# Patient Record
Sex: Male | Born: 1950 | Race: White | Hispanic: No | Marital: Single | Smoking: Never smoker
Health system: Southern US, Community
[De-identification: ages and names within clinical notes are randomized; demographics above are authoritative.]

## PROBLEM LIST (undated history)

## (undated) DIAGNOSIS — K5730 Diverticulosis of large intestine without perforation or abscess without bleeding: Secondary | ICD-10-CM

## (undated) DIAGNOSIS — E785 Hyperlipidemia, unspecified: Secondary | ICD-10-CM

## (undated) HISTORY — DX: Hyperlipidemia, unspecified: E78.5

## (undated) HISTORY — DX: Diverticulosis of large intestine without perforation or abscess without bleeding: K57.30

---

## 2001-12-19 ENCOUNTER — Encounter: Admission: RE | Admit: 2001-12-19 | Discharge: 2002-03-19 | Payer: Self-pay | Admitting: Family Medicine

## 2002-04-17 ENCOUNTER — Encounter: Admission: RE | Admit: 2002-04-17 | Discharge: 2002-07-16 | Payer: Self-pay | Admitting: Family Medicine

## 2005-12-08 ENCOUNTER — Ambulatory Visit: Payer: Self-pay | Admitting: Family Medicine

## 2006-01-20 ENCOUNTER — Ambulatory Visit: Payer: Self-pay | Admitting: Family Medicine

## 2006-01-20 LAB — CONVERTED CEMR LAB
BUN: 17 mg/dL (ref 6–23)
CO2: 26 meq/L (ref 19–32)
Creatinine, Ser: 0.8 mg/dL (ref 0.4–1.5)
Glucose, Bld: 209 mg/dL — ABNORMAL HIGH (ref 70–99)
Microalb Creat Ratio: 8.5 mg/g (ref 0.0–30.0)
Microalb, Ur: 1.6 mg/dL (ref 0.0–1.9)
Potassium: 4 meq/L (ref 3.5–5.1)

## 2006-03-13 ENCOUNTER — Ambulatory Visit: Payer: Self-pay | Admitting: Family Medicine

## 2006-03-13 LAB — CONVERTED CEMR LAB
BUN: 11 mg/dL (ref 6–23)
CO2: 26 meq/L (ref 19–32)
Calcium: 8.8 mg/dL (ref 8.4–10.5)
GFR calc non Af Amer: 125 mL/min
Glomerular Filtration Rate, Af Am: 152 mL/min/{1.73_m2}

## 2006-04-30 DIAGNOSIS — K5730 Diverticulosis of large intestine without perforation or abscess without bleeding: Secondary | ICD-10-CM | POA: Insufficient documentation

## 2006-04-30 DIAGNOSIS — E119 Type 2 diabetes mellitus without complications: Secondary | ICD-10-CM

## 2006-04-30 DIAGNOSIS — E785 Hyperlipidemia, unspecified: Secondary | ICD-10-CM

## 2006-12-14 ENCOUNTER — Encounter (INDEPENDENT_AMBULATORY_CARE_PROVIDER_SITE_OTHER): Payer: Self-pay | Admitting: Family Medicine

## 2007-03-29 ENCOUNTER — Ambulatory Visit: Payer: Self-pay | Admitting: Family Medicine

## 2007-03-29 ENCOUNTER — Encounter (INDEPENDENT_AMBULATORY_CARE_PROVIDER_SITE_OTHER): Payer: Self-pay | Admitting: Family Medicine

## 2007-03-29 DIAGNOSIS — R079 Chest pain, unspecified: Secondary | ICD-10-CM

## 2007-03-30 ENCOUNTER — Encounter (INDEPENDENT_AMBULATORY_CARE_PROVIDER_SITE_OTHER): Payer: Self-pay | Admitting: *Deleted

## 2007-03-30 ENCOUNTER — Telehealth (INDEPENDENT_AMBULATORY_CARE_PROVIDER_SITE_OTHER): Payer: Self-pay | Admitting: *Deleted

## 2007-03-30 LAB — CONVERTED CEMR LAB
ALT: 59 units/L — ABNORMAL HIGH (ref 0–53)
AST: 37 units/L (ref 0–37)
Direct LDL: 81.8 mg/dL
HDL: 28.7 mg/dL — ABNORMAL LOW (ref >39.0)
Triglycerides: 534 mg/dL (ref 0–149)

## 2007-04-06 ENCOUNTER — Ambulatory Visit: Payer: Self-pay | Admitting: Family Medicine

## 2007-04-12 LAB — CONVERTED CEMR LAB: Cholesterol: 111 mg/dL (ref 0–200)

## 2007-04-13 ENCOUNTER — Encounter (INDEPENDENT_AMBULATORY_CARE_PROVIDER_SITE_OTHER): Payer: Self-pay | Admitting: *Deleted

## 2007-04-16 ENCOUNTER — Ambulatory Visit: Payer: Self-pay | Admitting: Cardiology

## 2007-04-18 ENCOUNTER — Telehealth (INDEPENDENT_AMBULATORY_CARE_PROVIDER_SITE_OTHER): Payer: Self-pay | Admitting: *Deleted

## 2007-04-24 ENCOUNTER — Encounter (INDEPENDENT_AMBULATORY_CARE_PROVIDER_SITE_OTHER): Payer: Self-pay | Admitting: Family Medicine

## 2007-04-24 ENCOUNTER — Ambulatory Visit: Payer: Self-pay

## 2007-05-08 ENCOUNTER — Telehealth (INDEPENDENT_AMBULATORY_CARE_PROVIDER_SITE_OTHER): Payer: Self-pay | Admitting: Family Medicine

## 2007-05-16 ENCOUNTER — Ambulatory Visit: Payer: Self-pay | Admitting: Family Medicine

## 2007-05-17 ENCOUNTER — Telehealth (INDEPENDENT_AMBULATORY_CARE_PROVIDER_SITE_OTHER): Payer: Self-pay | Admitting: *Deleted

## 2007-05-17 LAB — CONVERTED CEMR LAB
BUN: 11 mg/dL (ref 6–23)
CO2: 29 meq/L (ref 19–32)
Calcium: 9.4 mg/dL (ref 8.4–10.5)
Chloride: 104 meq/L (ref 96–112)
Creatinine, Ser: 0.6 mg/dL (ref 0.4–1.5)

## 2007-06-01 ENCOUNTER — Ambulatory Visit: Payer: Self-pay | Admitting: Family Medicine

## 2007-06-01 ENCOUNTER — Telehealth (INDEPENDENT_AMBULATORY_CARE_PROVIDER_SITE_OTHER): Payer: Self-pay | Admitting: *Deleted

## 2007-06-01 LAB — CONVERTED CEMR LAB
Bilirubin Urine: NEGATIVE
Urobilinogen, UA: 0.2

## 2007-06-02 ENCOUNTER — Encounter: Payer: Self-pay | Admitting: Family Medicine

## 2007-06-02 LAB — CONVERTED CEMR LAB
AST: 35 units/L (ref 0–37)
Albumin: 5 g/dL (ref 3.5–5.2)
Alkaline Phosphatase: 45 units/L (ref 39–117)
CO2: 24 meq/L (ref 19–32)
Glucose, Bld: 145 mg/dL — ABNORMAL HIGH (ref 70–99)
Potassium: 4.1 meq/L (ref 3.5–5.3)
Sodium: 141 meq/L (ref 135–145)
Total Bilirubin: 1 mg/dL (ref 0.3–1.2)

## 2007-06-04 ENCOUNTER — Encounter (INDEPENDENT_AMBULATORY_CARE_PROVIDER_SITE_OTHER): Payer: Self-pay | Admitting: *Deleted

## 2007-06-18 ENCOUNTER — Ambulatory Visit: Payer: Self-pay | Admitting: Family Medicine

## 2007-06-19 ENCOUNTER — Encounter (INDEPENDENT_AMBULATORY_CARE_PROVIDER_SITE_OTHER): Payer: Self-pay | Admitting: *Deleted

## 2007-06-25 ENCOUNTER — Encounter (INDEPENDENT_AMBULATORY_CARE_PROVIDER_SITE_OTHER): Payer: Self-pay | Admitting: *Deleted

## 2007-06-26 ENCOUNTER — Telehealth (INDEPENDENT_AMBULATORY_CARE_PROVIDER_SITE_OTHER): Payer: Self-pay | Admitting: *Deleted

## 2007-09-17 ENCOUNTER — Ambulatory Visit: Payer: Self-pay | Admitting: Family Medicine

## 2007-09-26 ENCOUNTER — Telehealth (INDEPENDENT_AMBULATORY_CARE_PROVIDER_SITE_OTHER): Payer: Self-pay | Admitting: *Deleted

## 2007-09-27 ENCOUNTER — Encounter (INDEPENDENT_AMBULATORY_CARE_PROVIDER_SITE_OTHER): Payer: Self-pay | Admitting: *Deleted

## 2007-09-27 LAB — CONVERTED CEMR LAB
Albumin: 4.3 g/dL (ref 3.5–5.2)
Alkaline Phosphatase: 40 units/L (ref 39–117)
Bilirubin, Direct: 0.2 mg/dL (ref 0.0–0.3)
Calcium: 9.3 mg/dL (ref 8.4–10.5)
Direct LDL: 34.5 mg/dL
GFR calc Af Amer: 151 mL/min
GFR calc non Af Amer: 124 mL/min
Glucose, Bld: 130 mg/dL — ABNORMAL HIGH (ref 70–99)
HDL: 24.9 mg/dL — ABNORMAL LOW (ref >39.0)
Potassium: 4.3 meq/L (ref 3.5–5.1)
Sodium: 143 meq/L (ref 135–145)
Total Protein: 6.8 g/dL (ref 6.0–8.3)
Triglycerides: 201 mg/dL (ref 0–149)
VLDL: 40 mg/dL (ref 0–40)

## 2007-12-26 ENCOUNTER — Ambulatory Visit: Payer: Self-pay | Admitting: Family Medicine

## 2008-01-01 LAB — CONVERTED CEMR LAB
AST: 34 units/L (ref 0–37)
Albumin: 4.2 g/dL (ref 3.5–5.2)
Alkaline Phosphatase: 31 units/L — ABNORMAL LOW (ref 39–117)
BUN: 17 mg/dL (ref 6–23)
Chloride: 104 meq/L (ref 96–112)
Cholesterol: 91 mg/dL (ref 0–200)
Creatinine, Ser: 0.7 mg/dL (ref 0.4–1.5)
Glucose, Bld: 161 mg/dL — ABNORMAL HIGH (ref 70–99)
Hgb A1c MFr Bld: 6.8 % — ABNORMAL HIGH (ref 4.6–6.0)
Potassium: 4 meq/L (ref 3.5–5.1)
Total Protein: 6.8 g/dL (ref 6.0–8.3)
Triglycerides: 181 mg/dL — ABNORMAL HIGH (ref 0–149)
VLDL: 36 mg/dL (ref 0–40)

## 2008-01-02 ENCOUNTER — Encounter (INDEPENDENT_AMBULATORY_CARE_PROVIDER_SITE_OTHER): Payer: Self-pay | Admitting: *Deleted

## 2008-01-07 ENCOUNTER — Telehealth (INDEPENDENT_AMBULATORY_CARE_PROVIDER_SITE_OTHER): Payer: Self-pay | Admitting: *Deleted

## 2008-03-13 ENCOUNTER — Telehealth (INDEPENDENT_AMBULATORY_CARE_PROVIDER_SITE_OTHER): Payer: Self-pay | Admitting: *Deleted

## 2008-04-14 ENCOUNTER — Telehealth: Payer: Self-pay | Admitting: Family Medicine

## 2008-04-21 ENCOUNTER — Encounter: Payer: Self-pay | Admitting: Family Medicine

## 2008-04-28 ENCOUNTER — Telehealth (INDEPENDENT_AMBULATORY_CARE_PROVIDER_SITE_OTHER): Payer: Self-pay | Admitting: *Deleted

## 2008-04-30 ENCOUNTER — Encounter (INDEPENDENT_AMBULATORY_CARE_PROVIDER_SITE_OTHER): Payer: Self-pay | Admitting: *Deleted

## 2008-05-21 ENCOUNTER — Telehealth (INDEPENDENT_AMBULATORY_CARE_PROVIDER_SITE_OTHER): Payer: Self-pay | Admitting: *Deleted

## 2008-05-26 ENCOUNTER — Telehealth (INDEPENDENT_AMBULATORY_CARE_PROVIDER_SITE_OTHER): Payer: Self-pay | Admitting: *Deleted

## 2008-05-26 ENCOUNTER — Ambulatory Visit: Payer: Self-pay | Admitting: Family Medicine

## 2008-05-26 DIAGNOSIS — L570 Actinic keratosis: Secondary | ICD-10-CM

## 2008-07-09 ENCOUNTER — Encounter: Payer: Self-pay | Admitting: Family Medicine

## 2008-10-28 ENCOUNTER — Telehealth (INDEPENDENT_AMBULATORY_CARE_PROVIDER_SITE_OTHER): Payer: Self-pay | Admitting: *Deleted

## 2008-10-31 ENCOUNTER — Ambulatory Visit: Payer: Self-pay | Admitting: Family Medicine

## 2008-11-04 ENCOUNTER — Telehealth (INDEPENDENT_AMBULATORY_CARE_PROVIDER_SITE_OTHER): Payer: Self-pay | Admitting: *Deleted

## 2008-11-04 DIAGNOSIS — Z8639 Personal history of other endocrine, nutritional and metabolic disease: Secondary | ICD-10-CM | POA: Insufficient documentation

## 2008-11-04 DIAGNOSIS — Z862 Personal history of diseases of the blood and blood-forming organs and certain disorders involving the immune mechanism: Secondary | ICD-10-CM

## 2008-11-04 LAB — CONVERTED CEMR LAB
ALT: 67 units/L — ABNORMAL HIGH (ref 0–53)
AST: 54 units/L — ABNORMAL HIGH (ref 0–37)
Alkaline Phosphatase: 47 units/L (ref 39–117)
BUN: 15 mg/dL (ref 6–23)
Bilirubin, Direct: 0.2 mg/dL (ref 0.0–0.3)
Calcium: 9.1 mg/dL (ref 8.4–10.5)
Creatinine, Ser: 0.7 mg/dL (ref 0.4–1.5)
GFR calc non Af Amer: 123.88 mL/min (ref >60)
Glucose, Bld: 216 mg/dL — ABNORMAL HIGH (ref 70–99)
Total Bilirubin: 1.9 mg/dL — ABNORMAL HIGH (ref 0.3–1.2)

## 2008-11-06 ENCOUNTER — Ambulatory Visit: Payer: Self-pay | Admitting: Family Medicine

## 2008-11-07 ENCOUNTER — Encounter: Admission: RE | Admit: 2008-11-07 | Discharge: 2008-11-07 | Payer: Self-pay | Admitting: Family Medicine

## 2008-11-20 ENCOUNTER — Ambulatory Visit: Payer: Self-pay | Admitting: Family Medicine

## 2008-11-27 ENCOUNTER — Encounter: Payer: Self-pay | Admitting: Family Medicine

## 2008-11-27 ENCOUNTER — Telehealth: Payer: Self-pay | Admitting: Family Medicine

## 2008-12-02 LAB — CONVERTED CEMR LAB
AST: 42 units/L — ABNORMAL HIGH (ref 0–37)
Albumin: 4.3 g/dL (ref 3.5–5.2)
BUN: 15 mg/dL (ref 6–23)
Cholesterol: 86 mg/dL (ref 0–200)
Creatinine, Ser: 0.6 mg/dL (ref 0.4–1.5)
Creatinine,U: 149.1 mg/dL
GFR calc non Af Amer: 147.97 mL/min (ref >60)
GGT: 62 units/L — ABNORMAL HIGH (ref 7–51)
Glucose, Bld: 150 mg/dL — ABNORMAL HIGH (ref 70–99)
Microalb, Ur: 1.2 mg/dL (ref 0.0–1.9)
Potassium: 4.2 meq/L (ref 3.5–5.1)
VLDL: 21.8 mg/dL (ref 0.0–40.0)

## 2008-12-03 ENCOUNTER — Ambulatory Visit: Payer: Self-pay | Admitting: Family Medicine

## 2008-12-03 DIAGNOSIS — J069 Acute upper respiratory infection, unspecified: Secondary | ICD-10-CM | POA: Insufficient documentation

## 2009-10-20 ENCOUNTER — Encounter (INDEPENDENT_AMBULATORY_CARE_PROVIDER_SITE_OTHER): Payer: Self-pay | Admitting: *Deleted

## 2009-10-20 ENCOUNTER — Telehealth (INDEPENDENT_AMBULATORY_CARE_PROVIDER_SITE_OTHER): Payer: Self-pay | Admitting: *Deleted

## 2009-11-27 ENCOUNTER — Encounter: Payer: Self-pay | Admitting: Family Medicine

## 2010-04-04 LAB — CONVERTED CEMR LAB
ALT: 33 units/L (ref 0–53)
Albumin: 4.2 g/dL (ref 3.5–5.2)
BUN: 8 mg/dL (ref 6–23)
CO2: 28 meq/L (ref 19–32)
Calcium: 8.9 mg/dL (ref 8.4–10.5)
Creatinine, Ser: 0.6 mg/dL (ref 0.4–1.5)
Creatinine,U: 119.2 mg/dL
Glucose, Bld: 159 mg/dL — ABNORMAL HIGH (ref 70–99)
Microalb Creat Ratio: 15.9 mg/g (ref 0.0–30.0)
Total Bilirubin: 1.4 mg/dL — ABNORMAL HIGH (ref 0.3–1.2)
Total Protein: 6.7 g/dL (ref 6.0–8.3)

## 2010-04-06 NOTE — Letter (Signed)
Summary: Care Considerations Regarding A1C Monitoring & Eye Exam/Active H  Care Considerations Regarding A1C Monitoring & Eye Exam/Active Health Mgmt   Imported By: Lanelle Bal 12/16/2009 13:42:08  _____________________________________________________________________  External Attachment:    Type:   Image     Comment:   External Document

## 2010-04-06 NOTE — Letter (Signed)
Summary: Primary Care Appointment Letter  [facility] [place]  [address] [place], [place] [address]   Phone: [phone]  Fax: [phone]    10/20/2009 MRN: [id number]  [facility] [address] [place], [place]  [address]  Dear Mr. Nutting,   Your Primary Care Physician Loreen Freud DO has indicated that:    ___X____it is time to schedule an appointment.    _______you missed your appointment on______ and need to call and          reschedule.    ___X____you need to have lab work done.    _______you need to schedule an appointment discuss lab or test results.    _______you need to call to reschedule your appointment that is                       scheduled on _________.     Please call our office as soon as possible. Our phone number is [phone]. Please press option 1. Our office is open 8a-5p, Monday through Friday.     Thank you,    [facility]

## 2010-04-06 NOTE — Progress Notes (Signed)
Summary: refill  Phone Note Refill Request Message from:  Fax from Pharmacy on October 20, 2009 8:56 AM  Refills Requested: Medication #1:  JANUVIA 100 MG  TABS Take one tablet daily  Medication #2:  GLUCOPHAGE 1000 MG  TABS Take one tablet twice daily  Medication #3:  ZOCOR 20 MG  TABS 1 by mouth at bedtime [facility] fax [phone]  Initial call taken by: Okey Regal [place],  October 20, 2009 8:57 AM    New/Updated Medications: GLUCOPHAGE 1000 MG  TABS (METFORMIN HCL) Take one tablet twice daily**labs due** ZOCOR 20 MG  TABS (SIMVASTATIN) 1 by mouth at bedtime**labs due** JANUVIA 100 MG  TABS (SITAGLIPTIN PHOSPHATE) Take one tablet daily*OFFICE VISIT DUE NOW** Prescriptions: GLUCOPHAGE 1000 MG  TABS (METFORMIN HCL) Take one tablet twice daily**labs due**  #180 x 0   Entered by:   Jeremy Johann CMA   Authorized by:   Loreen Freud DO   Signed by:   Jeremy Johann CMA on 10/20/2009   Method used:   Faxed to ...       [facility] [address] Environmental education officer)       [facility]       [address]       [place], [place]  [address]       Ph: [phone]       Fax: [phone]   RxID:   [id number] ZOCOR 20 MG  TABS (SIMVASTATIN) 1 by mouth at bedtime**labs due**  #90 x 0   Entered by:   Jeremy Johann CMA   Authorized by:   Loreen Freud DO   Signed by:   Jeremy Johann CMA on 10/20/2009   Method used:   Faxed to ...       [facility] [address] Environmental education officer)       [facility]       [address]       [place], [place]  [address]       Ph: [phone]       Fax: [phone]   RxID:   [id number] JANUVIA 100 MG  TABS (SITAGLIPTIN PHOSPHATE) Take one tablet daily*OFFICE VISIT DUE NOW**  #90 x 0   Entered by:   Jeremy Johann CMA   Authorized by:   Loreen Freud DO   Signed by:   Jeremy Johann CMA on 10/20/2009   Method used:   Faxed to ...       [facility] [address] Environmental education officer)       [facility]       [address]       [place], [place]  [address]       Ph: [phone]       Fax: [phone]   RxID:   [id number]

## 2010-04-13 DIAGNOSIS — E119 Type 2 diabetes mellitus without complications: Secondary | ICD-10-CM

## 2010-04-13 DIAGNOSIS — Z0000 Encounter for general adult medical examination without abnormal findings: Secondary | ICD-10-CM

## 2010-04-13 DIAGNOSIS — E785 Hyperlipidemia, unspecified: Secondary | ICD-10-CM

## 2010-04-14 ENCOUNTER — Other Ambulatory Visit: Payer: Self-pay | Admitting: Family Medicine

## 2010-04-14 ENCOUNTER — Encounter: Payer: Self-pay | Admitting: Family Medicine

## 2010-04-14 ENCOUNTER — Encounter (INDEPENDENT_AMBULATORY_CARE_PROVIDER_SITE_OTHER): Payer: Self-pay | Admitting: Family Medicine

## 2010-04-14 DIAGNOSIS — E119 Type 2 diabetes mellitus without complications: Secondary | ICD-10-CM

## 2010-04-14 DIAGNOSIS — R1319 Other dysphagia: Secondary | ICD-10-CM | POA: Insufficient documentation

## 2010-04-14 DIAGNOSIS — E785 Hyperlipidemia, unspecified: Secondary | ICD-10-CM

## 2010-04-14 DIAGNOSIS — Z0000 Encounter for general adult medical examination without abnormal findings: Secondary | ICD-10-CM

## 2010-04-14 LAB — BASIC METABOLIC PANEL
GFR: 131.9 mL/min (ref >60.00)
Potassium: 4.1 mEq/L (ref 3.5–5.1)
Sodium: 140 mEq/L (ref 135–145)

## 2010-04-14 LAB — CONVERTED CEMR LAB
Blood in Urine, dipstick: NEGATIVE
Glucose, Urine, Semiquant: NEGATIVE
Specific Gravity, Urine: 1.02
pH: 6

## 2010-04-14 LAB — LIPID PANEL
HDL: 29.2 mg/dL — ABNORMAL LOW (ref >39.00)
Total CHOL/HDL Ratio: 3
VLDL: 50.4 mg/dL — ABNORMAL HIGH (ref 0.0–40.0)

## 2010-04-14 LAB — HEPATIC FUNCTION PANEL
ALT: 39 U/L (ref 0–53)
AST: 32 U/L (ref 0–37)
Albumin: 4.6 g/dL (ref 3.5–5.2)
Alkaline Phosphatase: 43 U/L (ref 39–117)

## 2010-04-14 LAB — CBC WITH DIFFERENTIAL/PLATELET
Eosinophils Relative: 2.3 % (ref 0.0–5.0)
HCT: 44.3 % (ref 39.0–52.0)
Hemoglobin: 15.5 g/dL (ref 13.0–17.0)
Lymphs Abs: 2.4 10*3/uL (ref 0.7–4.0)
Monocytes Relative: 6.7 % (ref 3.0–12.0)
Neutro Abs: 5.6 10*3/uL (ref 1.4–7.7)
RBC: 4.95 Mil/uL (ref 4.22–5.81)
WBC: 8.8 10*3/uL (ref 4.5–10.5)

## 2010-04-14 LAB — LDL CHOLESTEROL, DIRECT: Direct LDL: 34.1 mg/dL

## 2010-04-14 LAB — MICROALBUMIN / CREATININE URINE RATIO: Microalb Creat Ratio: 2.8 mg/g (ref 0.0–30.0)

## 2010-04-14 LAB — TSH: TSH: 1.11 u[IU]/mL (ref 0.35–5.50)

## 2010-04-15 ENCOUNTER — Telehealth (INDEPENDENT_AMBULATORY_CARE_PROVIDER_SITE_OTHER): Payer: Self-pay | Admitting: *Deleted

## 2010-04-16 ENCOUNTER — Encounter: Payer: Self-pay | Admitting: Family Medicine

## 2010-04-16 ENCOUNTER — Telehealth (INDEPENDENT_AMBULATORY_CARE_PROVIDER_SITE_OTHER): Payer: Self-pay | Admitting: *Deleted

## 2010-04-16 ENCOUNTER — Other Ambulatory Visit: Payer: Self-pay | Admitting: Gastroenterology

## 2010-04-16 DIAGNOSIS — R1906 Epigastric swelling, mass or lump: Secondary | ICD-10-CM

## 2010-04-19 ENCOUNTER — Encounter: Payer: Self-pay | Admitting: Family Medicine

## 2010-04-20 ENCOUNTER — Encounter: Payer: Self-pay | Admitting: Family Medicine

## 2010-04-20 ENCOUNTER — Telehealth (INDEPENDENT_AMBULATORY_CARE_PROVIDER_SITE_OTHER): Payer: Self-pay | Admitting: *Deleted

## 2010-04-22 ENCOUNTER — Other Ambulatory Visit: Payer: BC Managed Care – PPO

## 2010-04-22 NOTE — Assessment & Plan Note (Signed)
Summary: yearly//fd   Vital Signs:  Patient profile:   60 year old male Height:      67 inches Weight:      173.0 pounds BMI:     27.19 Pulse rate:   74 / minute Pulse rhythm:   regular BP sitting:   110 / 72  (right arm) Cuff size:   large  Vitals Entered By: Almeta Monas CMA Duncan Dull) (April 14, 2010 8:54 AM) CC: CPX/Fasting----no complaints   History of Present Illness: Pt here for cpe and labs.   Type 1 diabetes mellitus follow-up      This is a 60 year old man who presents with Type 2 diabetes mellitus follow-up.  The patient denies polyuria, polydipsia, blurred vision, self managed hypoglycemia, hypoglycemia requiring help, weight loss, weight gain, and numbness of extremities.  The patient denies the following symptoms: neuropathic pain, chest pain, vomiting, orthostatic symptoms, poor wound healing, intermittent claudication, vision loss, and foot ulcer.  Since the last visit the patient reports good dietary compliance, compliance with medications, not exercising regularly, and monitoring blood glucose.  The patient has been measuring capillary blood glucose before breakfast and before dinner.  Since the last visit, the patient reports having had eye care by an ophthalmologist and no foot care.    Hyperlipidemia follow-up      The patient also presents for Hyperlipidemia follow-up.  The patient denies muscle aches, GI upset, abdominal pain, flushing, itching, constipation, diarrhea, and fatigue.  The patient denies the following symptoms: chest pain/pressure, exercise intolerance, dypsnea, palpitations, syncope, and pedal edema.  Compliance with medications (by patient report) has been near 100%.  Dietary compliance has been good.  The patient reports no exercise.  Adjunctive measures currently used by the patient include ASA and fish oil supplements.    Pt c/o loose stools most of the time---no abd cramping but he does have L upper chest pain with eating a big meal and than it  subsides gradually.   no otc trieds.   Preventive Screening-Counseling & Management  Alcohol-Tobacco     Alcohol drinks/day: <1     Alcohol type: spirits     Smoking Status: never  Caffeine-Diet-Exercise     Caffeine use/day: 0     Does Patient Exercise: no     Exercise Counseling: to improve exercise regimen  Hep-HIV-STD-Contraception     Dental Visit-last 6 months yes     Dental Care Counseling: not indicated; dental care within six months      Sexual History:  currently monogamous and live in girlfriend.    Problems Prior to Update: 1)  ? of Ibs  (ICD-564.1) 2)  Other Dysphagia  (ICD-787.29) 3)  Uri  (ICD-465.9) 4)  Liver Function Tests, Abnormal, Hx of  (ICD-V12.2) 5)  Actinic Keratosis  (ICD-702.0) 6)  Family History Diabetes 1st Degree Relative  (ICD-V18.0) 7)  Preventive Health Care  (ICD-V70.0) 8)  Chest Pain  (ICD-786.50) 9)  Hyperlipidemia  (ICD-272.4) 10)  Diverticulosis, Colon  (ICD-562.10) 11)  Diabetes Mellitus, Type II  (ICD-250.00)  Medications Prior to Update: 1)  Glucophage 1000 Mg  Tabs (Metformin Hcl) .... Take One Tablet Twice Daily**labs Due** 2)  Zocor 20 Mg  Tabs (Simvastatin) .Marland Kitchen.. 1 By Mouth At Bedtime**labs Due** 3)  Januvia 100 Mg  Tabs (Sitagliptin Phosphate) .... Take One Tablet Daily*office Visit Due Now** 4)  Lovaza 1 Gm Caps (Omega-3-Acid Ethyl Esters) .... Take 2 Tablets Daily Two Times A Day 5)  Actos 30 Mg Tabs (Pioglitazone  Hcl) .... 1 By Mouth Once Daly. 6)  Xyzal 5 Mg Tabs (Levocetirizine Dihydrochloride) .Marland Kitchen.. 1 By Mouth Once Daily  Current Medications (verified): 1)  Janumet 50-1000 Mg Tabs (Sitagliptin-Metformin Hcl) .Marland Kitchen.. 1 By Mouth Two Times A Day 2)  Zocor 20 Mg  Tabs (Simvastatin) .Marland Kitchen.. 1 By Mouth At Bedtime 3)  Fish Oil 1000 Mg Caps (Omega-3 Fatty Acids) .Marland Kitchen.. 1 By Mouth Two Times A Day 4)  Aspirin 81 Mg Tbec (Aspirin) .Marland Kitchen.. 1 By Mouth Once Daily 5)  Lisinopril 5 Mg Tabs (Lisinopril) .Marland Kitchen.. 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 CAD-- B bypass at 45 and M MI at 60yo Family History Diabetes 1st degree relative--- B and F  Family History Kidney disease Family History Liver disease  Social History: Reviewed history from 03/29/2007 and no changes required. 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 Eyes:  Denies blurring, discharge, double vision, eye irritation, eye pain, halos, itching, light sensitivity, red eye, vision loss-1 eye, and vision loss-both eyes; optho-- q1y--[facility]by. ENT:  Denies decreased hearing, difficulty swallowing, ear discharge, earache, hoarseness, nasal congestion, nosebleeds, postnasal drainage, ringing in ears, sinus pressure, and sore throat. CV:  Denies bluish discoloration of lips or nails, chest pain or discomfort, difficulty breathing at  night, difficulty breathing while lying down, fainting, fatigue, leg cramps with exertion, lightheadness, near fainting, palpitations, shortness of breath with exertion, swelling of feet, swelling of hands, and weight gain. Resp:  Denies chest discomfort, chest pain with inspiration, cough, coughing up blood, excessive snoring, hypersomnolence, morning headaches, pleuritic, shortness of breath, sputum productive, and wheezing. GI:  Denies abdominal pain, bloody stools, change in bowel habits, constipation, dark tarry stools, diarrhea, excessive appetite, gas, hemorrhoids, indigestion, loss of appetite, nausea, vomiting, vomiting blood, and yellowish skin color. GU:  Denies decreased libido, discharge, dysuria, erectile dysfunction, genital sores, hematuria, incontinence, nocturia, urinary frequency, and urinary hesitancy. MS:  Denies joint pain, joint redness, joint swelling, loss of strength, low back pain, mid back pain, muscle aches, muscle , cramps, muscle weakness, stiffness, and thoracic pain. Derm:  Denies changes in color of skin, changes in nail beds, dryness, excessive perspiration, flushing, hair loss, insect bite(s), itching, lesion(s), poor wound healing, and rash. Neuro:  Denies brief paralysis, difficulty with concentration, disturbances in coordination, falling down, headaches, inability to speak, memory loss, numbness, poor balance, seizures, sensation of room spinning, tingling, tremors, visual disturbances, and weakness. Psych:  Denies alternate hallucination ( auditory/visual), anxiety, depression, easily angered, easily tearful,  irritability, mental problems, panic attacks, sense of great danger, suicidal thoughts/plans, thoughts of violence, unusual visions or sounds, and thoughts /plans of harming others. Endo:  Denies cold intolerance, excessive hunger, excessive thirst, excessive urination, heat intolerance, polyuria, and weight change. Heme:  Denies abnormal bruising, bleeding,  enlarge lymph nodes, fevers, pallor, and skin discoloration. Allergy:  Denies hives or rash, itching eyes, persistent infections, seasonal allergies, and sneezing.  Physical Exam  General:  Well-developed,well-nourished,in no acute distress; alert,appropriate and cooperative throughout examination Head:  Normocephalic and atraumatic without obvious abnormalities. No apparent alopecia or balding. Eyes:  pupils equal, pupils round, pupils reactive to light, and no injection.   Ears:  External ear exam shows no significant lesions or deformities.  Otoscopic examination reveals clear canals, tympanic membranes are intact bilaterally without bulging, retraction, inflammation or discharge. Hearing is grossly normal bilaterally. Nose:  External nasal examination shows no deformity or inflammation. Nasal mucosa are pink and moist without lesions or exudates. Mouth:  Oral mucosa and oropharynx without lesions or exudates.  Teeth in good repair. Neck:  No deformities, masses, or tenderness noted. Chest Wall:  No deformities, masses, tenderness or gynecomastia noted. Breasts:  No masses or gynecomastia noted Lungs:  Normal respiratory effort, chest expands symmetrically. Lungs are clear to auscultation, no crackles or wheezes. Heart:  normal rate and no murmur.   Abdomen:  Bowel sounds positive,abdomen soft and non-tender without masses, organomegaly or hernias noted. Rectal:  No external abnormalities noted. Normal sphincter tone. No rectal masses or tenderness. Genitalia:  Testes bilaterally descended without nodularity, tenderness or masses. No scrotal masses or lesions. No penis lesions or urethral discharge. Prostate:  Prostate gland firm and smooth, no enlargement, nodularity, tenderness, mass, asymmetry or induration. Msk:  normal ROM, no joint tenderness, no joint swelling, no joint warmth, no redness over joints, no joint deformities, no joint instability, and no crepitation.   Pulses:  R  posterior tibial normal, R dorsalis pedis normal, R carotid normal, L posterior tibial normal, L dorsalis pedis normal, and L carotid normal.   Extremities:  No clubbing, cyanosis, edema, or deformity noted with normal full range of motion of all joints.   Neurologic:  No cranial nerve deficits noted. Station and gait are normal. Plantar reflexes are down-going bilaterally. DTRs are symmetrical throughout. Sensory, motor and coordinative functions appear intact. Skin:  Intact without suspicious lesions or rashes Cervical Nodes:  No lymphadenopathy noted Axillary Nodes:  No palpable lymphadenopathy Psych:  Cognition and judgment appear intact. Alert and cooperative with normal attention span and concentration. No apparent delusions, illusions, hallucinations  Diabetes Management Exam:    Foot Exam (with socks and/or shoes not present):       Sensory-Pinprick/Light touch:          Left medial foot (L-4): normal          Left dorsal foot (L-5): normal          Left lateral foot (S-1): normal          Right medial foot (L-4): normal          Right dorsal foot (L-5): normal          Right lateral foot (S-1): normal       Sensory-Monofilament:          Left foot: normal          Right foot: normal       Inspection:          Left foot: normal  Right foot: normal       Nails:          Left foot: normal          Right foot: normal    Eye Exam:       Eye Exam done elsewhere          Date: 04/22/2009          Results: normal          Done by: digby   Impression & Recommendations:  Problem # 1:  PREVENTIVE HEALTH CARE (ICD-V70.0)  Orders: Venipuncture (88416) TLB-Lipid Panel (80061-LIPID) TLB-BMP (Basic Metabolic Panel-BMET) (80048-METABOL) TLB-CBC Platelet - w/Differential (85025-CBCD) TLB-Hepatic/Liver Function Pnl (80076-HEPATIC) TLB-TSH (Thyroid Stimulating Hormone) (84443-TSH) TLB-A1C / Hgb A1C (Glycohemoglobin) (83036-A1C) TLB-Microalbumin/Creat Ratio, Urine  (82043-MALB) TLB-PSA (Prostate Specific Antigen) (84153-PSA) UA Dipstick W/ Micro (manual) (60630) EKG w/ Interpretation (93000)  Reviewed preventive care protocols, scheduled due services, and updated immunizations.  Problem # 2:  HYPERLIPIDEMIA (ICD-272.4)  The following medications were removed from the medication list:    Lovaza 1 Gm Caps (Omega-3-acid ethyl esters) .Marland Kitchen... Take 2 tablets daily two times a day His updated medication list for this problem includes:    Zocor 20 Mg Tabs (Simvastatin) .Marland Kitchen... 1 by mouth at bedtime  Orders: Venipuncture ([address]) TLB-Lipid Panel (80061-LIPID) TLB-BMP (Basic Metabolic Panel-BMET) (80048-METABOL) TLB-CBC Platelet - w/Differential (85025-CBCD) TLB-Hepatic/Liver Function Pnl (80076-HEPATIC) TLB-TSH (Thyroid Stimulating Hormone) (84443-TSH) TLB-A1C / Hgb A1C (Glycohemoglobin) (83036-A1C) TLB-Microalbumin/Creat Ratio, Urine (82043-MALB) TLB-PSA (Prostate Specific Antigen) (84153-PSA) EKG w/ Interpretation (93000)  Labs Reviewed: SGOT: 42 (11/20/2008)   SGPT: 52 (11/20/2008)   HDL:25.40 (11/20/2008), 22.80 (10/31/2008)  LDL:39 (11/20/2008), 34 (12/26/2007)  Chol:86 (11/20/2008), 89 (10/31/2008)  Trig:109.0 (11/20/2008), 218.0 (10/31/2008)  Problem # 3:  OTHER DYSPHAGIA (ICD-787.29)  Orders: Gastroenterology Referral (GI) EKG w/ Interpretation (93000)  Problem # 4:  DIABETES MELLITUS, TYPE II (ICD-250.00)  The following medications were removed from the medication list:    Januvia 100 Mg Tabs (Sitagliptin phosphate) .Marland Kitchen... Take one tablet daily*office visit due now**    Actos 30 Mg Tabs (Pioglitazone hcl) .Marland Kitchen... 1 by mouth once daly. His updated medication list for this problem includes:    Janumet 50-1000 Mg Tabs (Sitagliptin-metformin hcl) .Marland Kitchen... 1 by mouth two times a day    Aspirin 81 Mg Tbec (Aspirin) .Marland Kitchen... 1 by mouth once daily    Lisinopril 5 Mg Tabs (Lisinopril) .Marland Kitchen... 1 by mouth once daily  Orders: Venipuncture (93235) TLB-Lipid  Panel (80061-LIPID) TLB-BMP (Basic Metabolic Panel-BMET) (80048-METABOL) TLB-CBC Platelet - w/Differential (85025-CBCD) TLB-Hepatic/Liver Function Pnl (80076-HEPATIC) TLB-TSH (Thyroid Stimulating Hormone) (84443-TSH) TLB-A1C / Hgb A1C (Glycohemoglobin) (83036-A1C) TLB-Microalbumin/Creat Ratio, Urine (82043-MALB) TLB-PSA (Prostate Specific Antigen) (84153-PSA) EKG w/ Interpretation (93000)  Labs Reviewed: Creat: 0.6 (11/20/2008)     Last Eye Exam: normal (04/22/2009) Reviewed HgBA1c results: 7.3 (11/20/2008)  7.5 (10/31/2008)  Complete Medication List: 1)  Janumet 50-1000 Mg Tabs (Sitagliptin-metformin hcl) .Marland Kitchen.. 1 by mouth two times a day 2)  Zocor 20 Mg Tabs (Simvastatin) .Marland Kitchen.. 1 by mouth at bedtime 3)  Fish Oil 1000 Mg Caps (Omega-3 fatty acids) .Marland Kitchen.. 1 by mouth two times a day 4)  Aspirin 81 Mg Tbec (Aspirin) .Marland Kitchen.. 1 by mouth once daily 5)  Lisinopril 5 Mg Tabs (Lisinopril) .Marland Kitchen.. 1 by mouth once daily  Patient Instructions: 1)  Please schedule a follow-up appointment in 6 months .  Prescriptions: ZOCOR 20 MG  TABS (SIMVASTATIN) 1 by mouth at bedtime  #90 x 3   Entered and Authorized by:  Loreen Freud DO   Signed by:   Loreen Freud DO on 04/14/2010   Method used:   Faxed to ...       [facility])       [address] [address]       [place], [place]  [address]       Ph: [phone]       Fax: [phone]   RxID:   [id number] JANUMET 50-1000 MG TABS (SITAGLIPTIN-METFORMIN HCL) 1 by mouth two times a day  #180 x 3   Entered and Authorized by:   Loreen Freud DO   Signed by:   Loreen Freud DO on 04/14/2010   Method used:   Faxed to ...       [facility] Environmental education [facility])       [address] [address]       [place], [place]  [address]       Ph: [phone]       Fax: [phone]   RxID:   [id number] LISINOPRIL 5 MG TABS (LISINOPRIL) 1 by mouth once daily  #90 x 3   Entered and Authorized by:   Loreen Freud DO   Signed by:   Loreen Freud DO on 04/14/2010   Method used:   Faxed to  ...       [facility] Environmental education [facility])       [address] [address]       [place], [place]  [address]       Ph: [phone]       Fax: [phone]   RxID:   [id number]    Orders Added: 1)  Venipuncture [51761] 2)  TLB-Lipid Panel [80061-LIPID] 3)  TLB-BMP (Basic Metabolic Panel-BMET) [80048-METABOL] 4)  TLB-CBC Platelet - w/Differential [85025-CBCD] 5)  TLB-Hepatic/Liver Function Pnl [80076-HEPATIC] 6)  TLB-TSH (Thyroid Stimulating Hormone) [84443-TSH] 7)  TLB-A1C / Hgb A1C (Glycohemoglobin) [83036-A1C] 8)  TLB-Microalbumin/Creat Ratio, Urine [82043-MALB] 9)  TLB-PSA (Prostate Specific Antigen) [84153-PSA] 10)  UA Dipstick W/ Micro (manual) [81000] 11)  Gastroenterology Referral [GI] 12)  Est. Patient 40-64 years [99[address]] 13)  EKG w/ Interpretation [93000]     Laboratory Results   Urine Tests   Date/Time Reported: April 14, 2010 10:46 AM   Routine Urinalysis   Color: yellow Appearance: Clear Glucose: negative   (Normal Range: Negative) Bilirubin: negative   (Normal Range: Negative) Ketone: large (80)   (Normal Range: Negative) Spec. Gravity: 1.020   (Normal Range: 1.003-1.035) Blood: negative   (Normal Range: Negative) pH: 6.0   (Normal Range: 5.0-8.0) Protein: negative   (Normal Range: Negative) Urobilinogen: negative   (Normal Range: 0-1) Nitrite: negative   (Normal Range: Negative) Leukocyte Esterace: negative   (Normal Range: Negative)    Comments: Floydene Flock  April 14, 2010 10:47 AM

## 2010-04-22 NOTE — Progress Notes (Signed)
Summary: Results 2/9  Phone Note Outgoing Call   Call placed by: Almeta Monas CMA Duncan Dull),  April 15, 2010 4:35 PM Call placed to: Patient Details for Reason: TG high----start Antara 130 mg  #30  1 by mouth once daily ,  2 refills with coupon---try first----if no  problems we can send for mail order----recheck 3 months---250.00  272.4  lipid, hep, bmp, hgba1c   we don't have stress test and colon---- need results from [facility] ---pt may need to sign release Summary of Call: Left message to call back... Almeta Monas CMA Duncan Dull)  April 15, 2010 4:35 PM   Follow-up for Phone Call        spoke w/ patient aware of results and that medication needs to be started will call back before medication gets to low to see if he tolerated medication so that it can be sent to [facility].....Marland KitchenMarland KitchenDoristine Devoid CMA  April 15, 2010 4:59 PM     New/Updated Medications: ANTARA 130 MG CAPS (FENOFIBRATE MICRONIZED) take one tablet daiy Prescriptions: ANTARA 130 MG CAPS (FENOFIBRATE MICRONIZED) take one tablet daiy  #30 x 0   Entered by:   Doristine Devoid CMA   Authorized by:   Loreen Freud DO   Signed by:   Doristine Devoid CMA on 04/15/2010   Method used:   Electronically to        [id number] (retail)       [address]       [place], [place]  [address]       Ph: [phone] or [phone]       Fax: [phone]   RxID:   [id number]

## 2010-04-23 ENCOUNTER — Encounter: Payer: Self-pay | Admitting: Family Medicine

## 2010-04-28 NOTE — Progress Notes (Signed)
Summary: janumet (prior auth) or alternate med APPROVED  Phone Note Refill Request Message from:  Fax from Pharmacy on April 20, 2010 3:40 PM  Refills Requested: Medication #1:  JANUMET 50-1000 MG TABS 1 by mouth two times a day [facility], fax [phone]     needs prior auth----phone [phone]     or alternate medication ---will take this form to Soledad Gerlach  Initial call taken by: Jerolyn Shin,  April 20, 2010 3:41 PM  Follow-up for Phone Call        per Dr.Lowne doest want to change to the alternatives because they don't work.Marland KitchenMarland KitchenWill forward to [facility] for Prior auth Follow-up by: Almeta Monas CMA Duncan Dull),  April 20, 2010 3:47 PM  Additional Follow-up for Phone Call Additional follow up Details #1::        Prior Auth approved 04-20-10 until 04-20-11. Pt copay for 3 month supply is $60. Approval letter scan to chart.........Marland KitchenFelecia Deloach CMA  April 20, 2010 4:45 PM

## 2010-04-28 NOTE — Medication Information (Signed)
Summary: Antara Approved  Antara Approved   Imported By: Maryln Gottron 04/22/2010 13:04:49  _____________________________________________________________________  External Attachment:    Type:   Image     Comment:   External Document

## 2010-04-28 NOTE — Progress Notes (Signed)
Summary: Prior Auth Approved [employer] [facility]  Phone Note Refill Request Call back at [phone] Message from:  Pharmacy on April 16, 2010 9:32 AM  Refills Requested: Medication #1:  ANTARA 130 MG CAPS take one tablet daiy.   Dosage confirmed as above?Dosage Confirmed Prior Auth from [employer] ID#: [id number]  Initial call taken by: Harold Barban,  April 16, 2010 9:32 AM  Follow-up for Phone Call        Approved 04-19-10 until 04-19-11....Marland KitchenMarland KitchenFelecia Deloach CMA  April 19, 2010 9:01 AM

## 2010-05-01 ENCOUNTER — Encounter: Payer: Self-pay | Admitting: Family Medicine

## 2010-05-04 NOTE — Consult Note (Signed)
Summary: [facility]  [facility]   Imported By: Maryln Gottron 04/27/2010 14:38:15  _____________________________________________________________________  External Attachment:    Type:   Image     Comment:   External Document

## 2010-05-04 NOTE — Medication Information (Signed)
Summary: Approval for Janumet  Approval for Janumet   Imported By: Maryln Gottron 04/27/2010 13:53:07  _____________________________________________________________________  External Attachment:    Type:   Image     Comment:   External Document

## 2010-05-13 NOTE — Medication Information (Signed)
Summary: Step Therapy Request for Fenofibrate  Step Therapy Request for Fenofibrate   Imported By: Maryln Gottron 05/07/2010 11:20:21  _____________________________________________________________________  External Attachment:    Type:   Image     Comment:   External Document

## 2010-05-25 NOTE — Letter (Signed)
Summary: [employer]  [employer]   Imported By: Maryln Gottron 05/19/2010 15:03:13  _____________________________________________________________________  External Attachment:    Type:   Image     Comment:   External Document

## 2010-07-20 NOTE — Assessment & Plan Note (Signed)
[facility]                            CARDIOLOGY OFFICE NOTE   NAME:Michael Blake, Michael Blake                    MRN:          [id number]  DATE:04/16/2007                            DOB:          1950-12-26    Michael Blake is a 60 year old male whom I am asked to evaluate for chest  pain.  He has no prior cardiac history.  He typically does not have  significant orthopnea, PND or pedal edema.  He does not have dyspnea  with routine activities, but he can have some dyspnea with more extreme  activities.  He does not have exertional chest pain, palpitations or  syncope.  He saw a Dr. Blossom Blake recently and noted that he had  occasional pain in the left lateral breast area.  It was described as  sharp pain and it lasted for approximately hour.  It is not pleuritic,  but it can be increased with moving in certain ways.  There is no  associated nausea, vomiting, shortness of breath or diaphoresis.  The  pain is not exertional, nor is it related to food.  He also has a chest  tightness at times that he feels may be indigestion.  He notices this  predominantly after eating. This also was not exertional.  Does not  radiate, nor are there associated symptoms.  Because of the above, we  were asked to further evaluate.   MEDICATIONS:  1. Include aspirin 81 mg daily.  2. Vitamin D.  3. Niaspan.  4. Metformin 850 mg p.o. b.i.d.  5. He takes Ambien as needed.   There are no known drug allergies.   SOCIAL HISTORY:  He does not smoke.  He does occasionally consume  alcohol.   FAMILY HISTORY:  Is positive for coronary artery disease (his brother  has had coronary bypassing graft in his early 9s).   PAST MEDICAL HISTORY:  Significant for diabetes mellitus as well as  hyperlipidemia.  He denies any hypertension.  He has had no previous  surgeries.  He does have a history of diverticulosis.   REVIEW OF SYSTEMS:  He denies any headaches or fevers, chills.  There is  no productive cough or hemoptysis.  There is no dysphagia, odynophagia,  melena or hematochezia.  There is no dysuria or hematuria.  There is no  rash or seizure activity.  There is no orthopnea, PND or pedal edema.  There is no claudication.  Remaining systems are negative.   PHYSICAL EXAMINATION:  Today, shows a blood pressure of 132/92.  His  pulse is 91.  Weighs 184 pounds.  He is well-developed, well-nourished  and in no acute distress.  SKIN: Warm and dry.  He does not appear to be depressed.  There is no peripheral clubbing.  His back is normal.  HEENT: Is normal with normal eyelids.  NECK: Supple, with a normal upstroke bilaterally. Cannot appreciate  bruits. There is no jugular venous distention and no thyromegaly is  noted.  CHEST: Clear to auscultation, normal expansion.  CARDIOVASCULAR: Reveals a regular rate and rhythm, normal S1, S2. There  are no murmurs, rubs  or gallops noted.  ABDOMEN: Nontender. Positive bowel sounds. No hepatosplenomegaly. No  masses appreciated. There is no abdominal bruit.  He has 2+ femoral pulses bilaterally. No bruits.  EXTREMITIES: Show no edema.  I can palpate no cords. He has 2+ posterior  tibial pulses bilaterally.  NEUROLOGIC: Examination is grossly intact.   Electrocardiogram shows sinus rhythm at a rate of 88.  The axis is  normal.  There are no significant ST changes.   DIAGNOSES:  1. Atypical chest pain - His symptoms are atypical, but he does have      risk factors including diabetes mellitus, hyperlipidemia and a      positive family history.  We will plan to risk stratify with a      Myoview.  Shows no ischemia then we will plan to continue medical      therapy and risk factor modification.  I agree with aspirin at 81      mg daily.  2. Hyperlipidemia - He will continue on his Niaspan.  Given his      diabetes mellitus, he would benefit from a statin long term, but I      will leave this Dr. Blossom Blake.  3. Diabetes mellitus -  Per Dr. Blossom Blake.  4. History of diverticulosis.   We will see Michael Blake back on a p.r.n. basis pending the results of  his Myoview.     Michael Frieze Jens Som, MD, [facility]  Electronically Signed    BSC/MedQ  DD: 04/16/2007  DT: 04/17/2007  Job #: [id number]

## 2011-01-21 IMAGING — US US ABDOMEN COMPLETE
1 series · 14 of 25 positions shown · non-contrast
Comparison: None

CLINICAL DATA: Elevated liver function tests. Diabetes.

ABDOMINAL ULTRASOUND COMPLETE

[Series 1: us abdomen complete · 0.41mm/px · 14 of 117 slices shown]
[im 1/117]
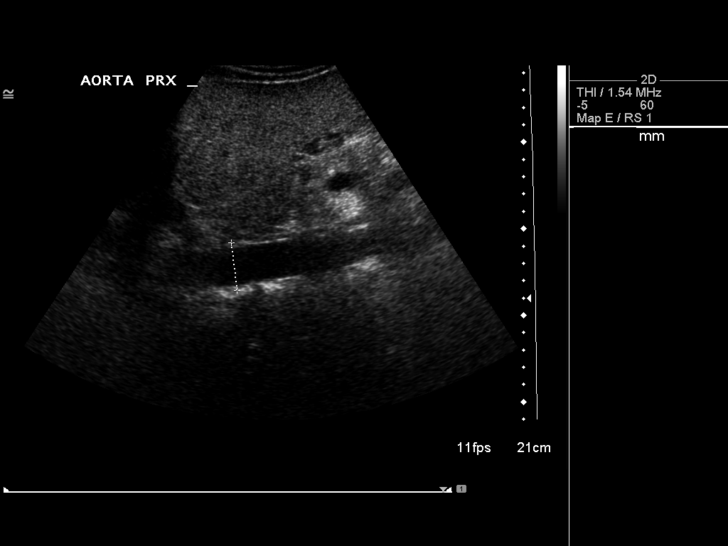
[im 10/117]
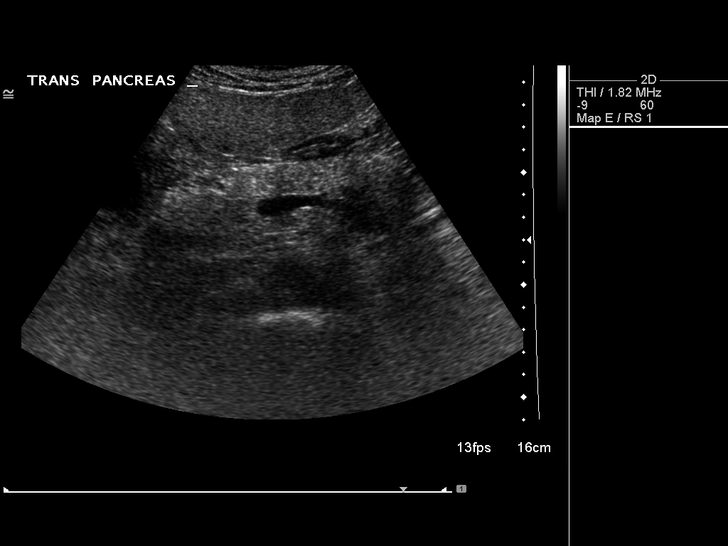
[im 20/117]
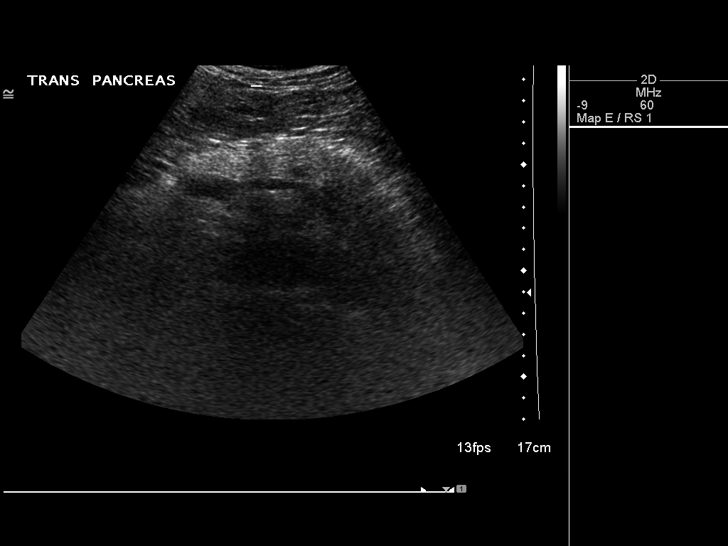
[im 30/117]
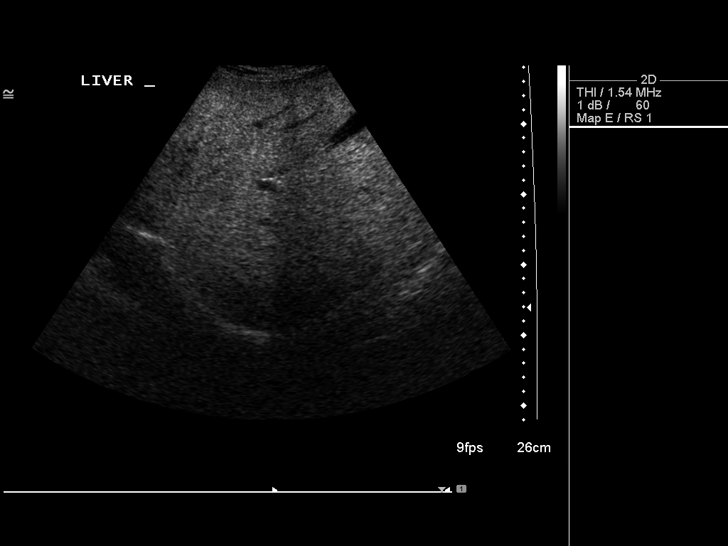
[im 39/117]
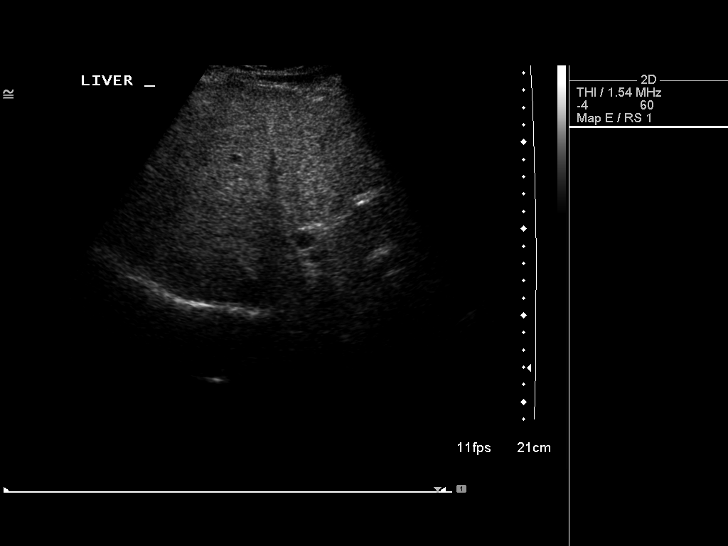
[im 44/117]
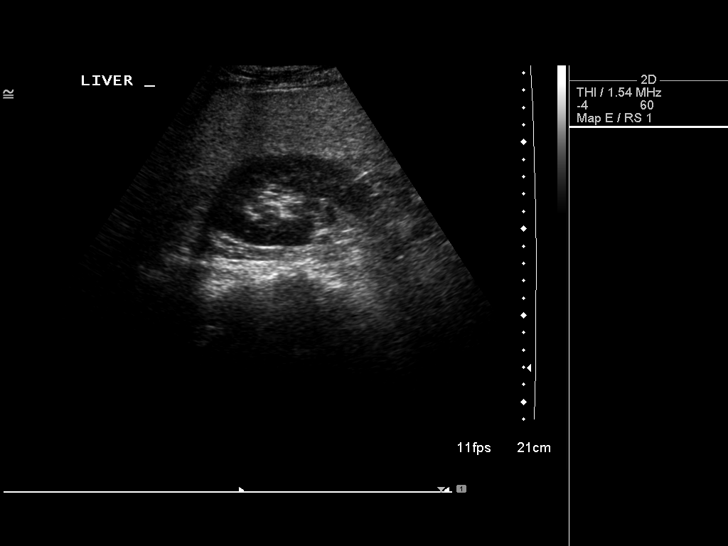
[im 54/117]
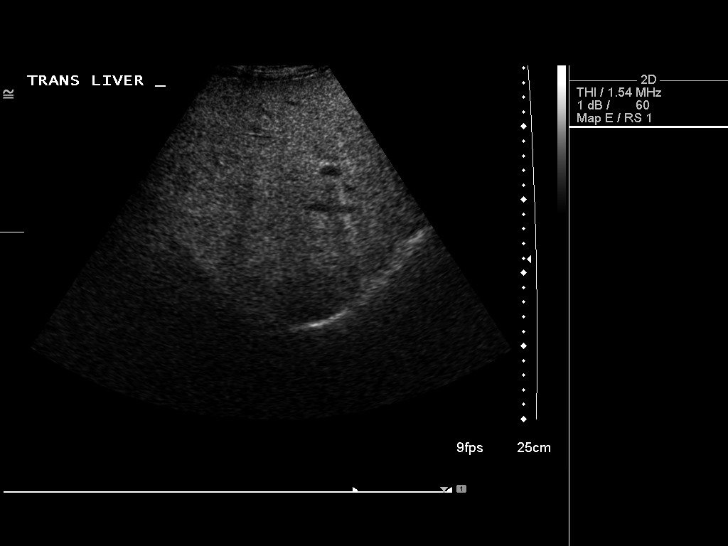
[im 63/117]
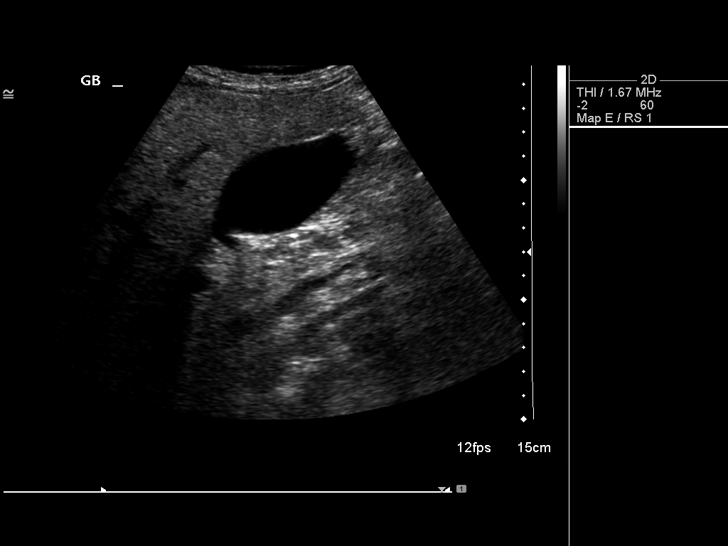
[im 73/117]
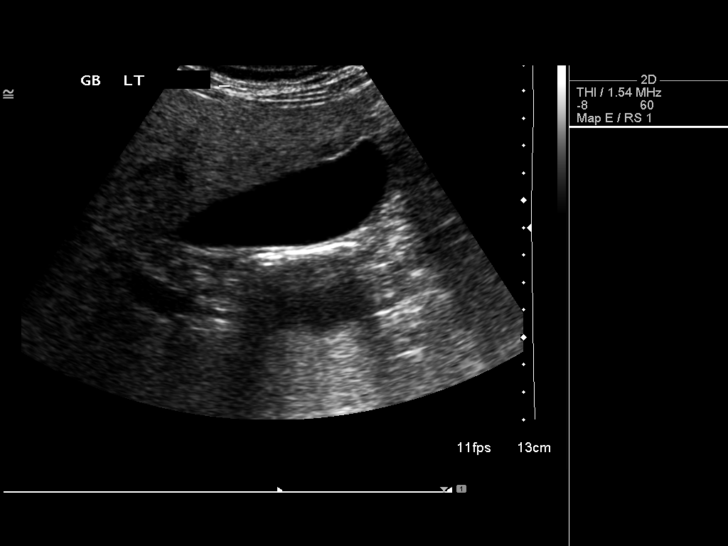
[im 78/117]
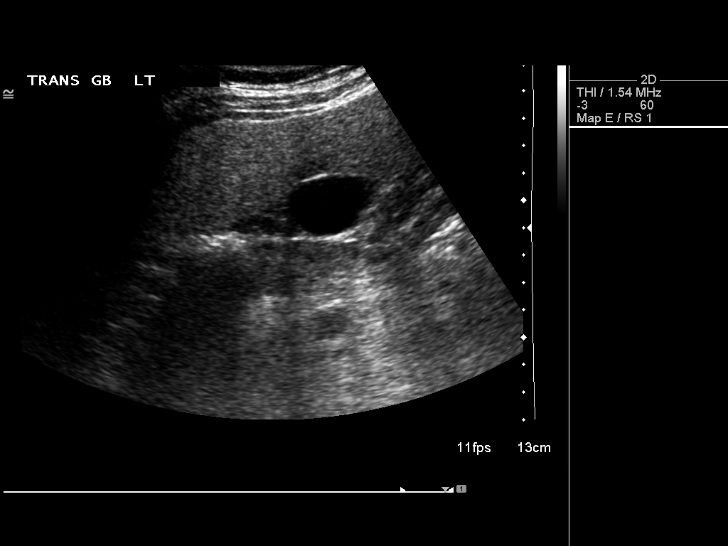
[im 88/117]
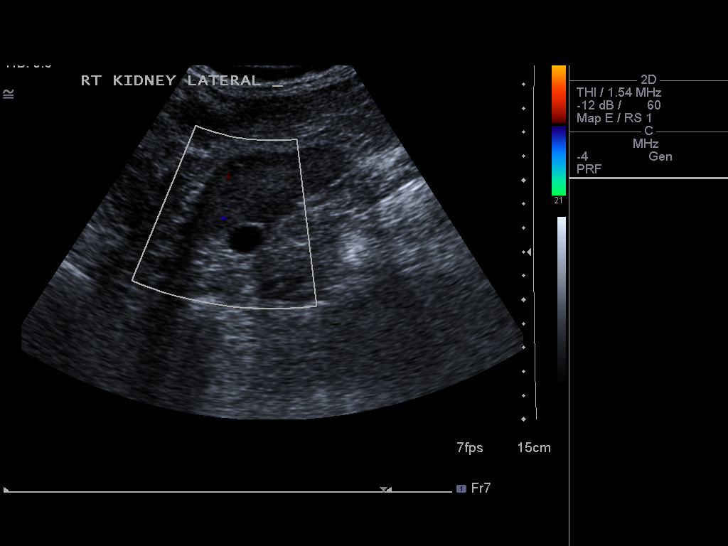
[im 97/117]
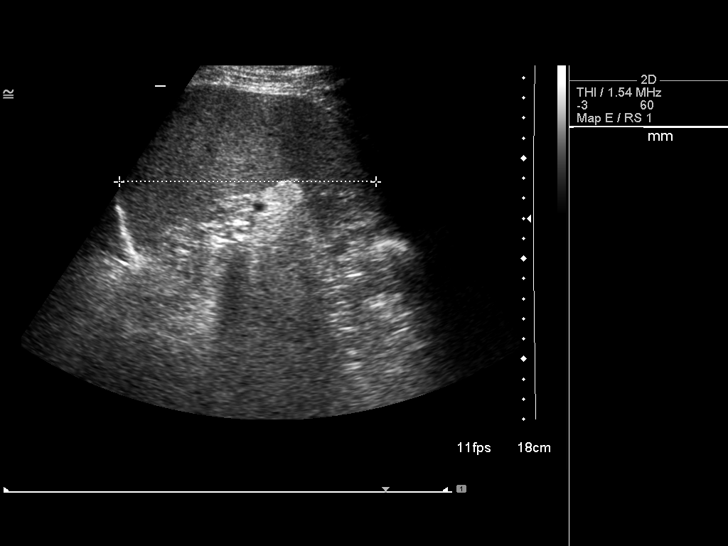
[im 107/117]
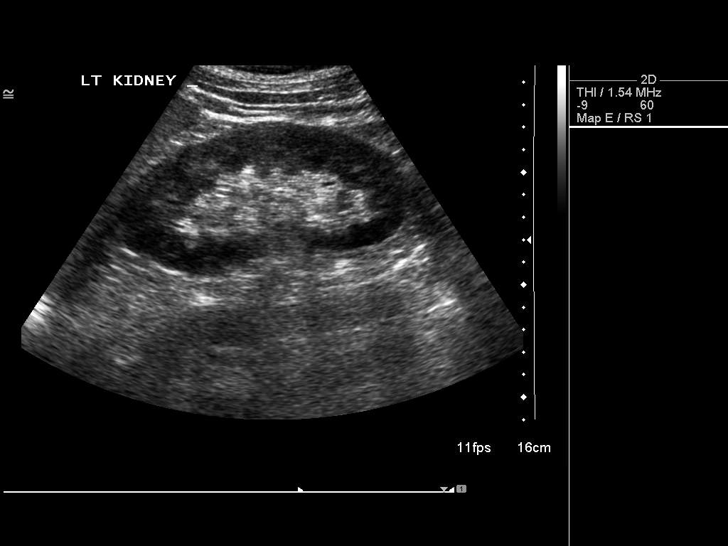
[im 117/117]
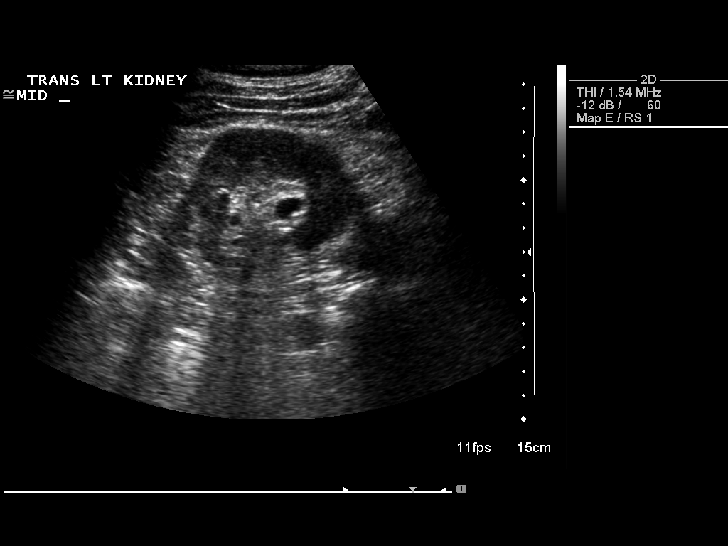

[14 of 25 positions shown; findings below may reference images not displayed]

FINDINGS: Gallbladder:  No gallstones, gallbladder wall thickening, or
pericholecystic fluid.

Common Bile Duct:  Within normal limits in caliber.  Measures 3 mm
in diameter.

Liver:  No focal lesion identified.  Diffusely increased
parenchymal echogenicity, consistent with diffuse fatty
infiltration.

IVC:  Appears normal.

Pancreas:  Although the pancreas is difficult to visualize in its
entirety, no focal pancreatic abnormality is identified.

Spleen:  Within normal limits in size and echotexture.

Right kidney:  Normal in size and parenchymal echogenicity.  No
evidence of mass or hydronephrosis.   Small simple right renal cyst
noted measuring 1.5 cm.

Left kidney:  Normal in size and parenchymal echogenicity.  No
evidence of mass or hydronephrosis.  Small parapelvic cyst
measuring 1.3 cm.

Abdominal Aorta:  No aneurysm identified.
IMPRESSION: 1.  No evidence of gallstones, biliary dilatation, or other acute
findings.
2.  Diffuse fatty infiltration of the liver.

## 2011-03-04 ENCOUNTER — Ambulatory Visit (INDEPENDENT_AMBULATORY_CARE_PROVIDER_SITE_OTHER): Payer: BC Managed Care – PPO

## 2011-03-04 DIAGNOSIS — J111 Influenza due to unidentified influenza virus with other respiratory manifestations: Secondary | ICD-10-CM

## 2011-03-04 DIAGNOSIS — R05 Cough: Secondary | ICD-10-CM

## 2011-03-04 DIAGNOSIS — R059 Cough, unspecified: Secondary | ICD-10-CM

## 2011-06-24 ENCOUNTER — Telehealth: Payer: Self-pay | Admitting: Family Medicine

## 2011-06-24 NOTE — Telephone Encounter (Signed)
Pt called triage line and left msg for someone to call him back. Did not leave any details or call back #.

## 2011-06-24 NOTE — Telephone Encounter (Signed)
I have attempted to contact this patient by phone with the following results: left message to return my call on answering machine (home).  

## 2011-06-27 MED ORDER — SIMVASTATIN 20 MG PO TABS: 20.0000 mg | ORAL_TABLET | Freq: Every day | ORAL | Status: DC

## 2011-06-27 MED ORDER — LISINOPRIL 5 MG PO TABS: 5.0000 mg | ORAL_TABLET | Freq: Every day | ORAL | Status: DC

## 2011-06-27 MED ORDER — SITAGLIPTIN PHOS-METFORMIN HCL 50-1000 MG PO TABS: 1.0000 | ORAL_TABLET | Freq: Two times a day (BID) | ORAL | Status: DC

## 2011-06-27 NOTE — Telephone Encounter (Signed)
Discussed with patient and he stated he has been trying to contact the office for 2 weeks to get his medication refilled. i advised he needs to keep his pending apt and will get 30 days only, he voiced understanding. Rx faxed.      KP

## 2011-07-15 ENCOUNTER — Encounter: Payer: BC Managed Care – PPO | Admitting: Family Medicine

## 2011-07-19 ENCOUNTER — Telehealth: Payer: Self-pay | Admitting: Family Medicine

## 2011-07-19 DIAGNOSIS — E119 Type 2 diabetes mellitus without complications: Secondary | ICD-10-CM

## 2011-07-19 DIAGNOSIS — E785 Hyperlipidemia, unspecified: Secondary | ICD-10-CM

## 2011-07-19 DIAGNOSIS — Z0000 Encounter for general adult medical examination without abnormal findings: Secondary | ICD-10-CM

## 2011-07-19 MED ORDER — SIMVASTATIN 20 MG PO TABS: 20.0000 mg | ORAL_TABLET | Freq: Every day | ORAL | Status: DC

## 2011-07-19 MED ORDER — LISINOPRIL 5 MG PO TABS: 5.0000 mg | ORAL_TABLET | Freq: Every day | ORAL | Status: DC

## 2011-07-19 MED ORDER — SITAGLIPTIN PHOS-METFORMIN HCL 50-1000 MG PO TABS: 1.0000 | ORAL_TABLET | Freq: Two times a day (BID) | ORAL | Status: DC

## 2011-07-19 MED ORDER — FENOFIBRATE MICRONIZED 130 MG PO CAPS: 130.0000 mg | ORAL_CAPSULE | Freq: Every day | ORAL | Status: DC

## 2011-07-19 NOTE — Telephone Encounter (Signed)
250.00  272.4  Lipid, hep, hgba1c, bmp, microalbumin  UA  psa v70.0

## 2011-07-19 NOTE — Telephone Encounter (Signed)
Please schedule labs. Thanks

## 2011-07-19 NOTE — Telephone Encounter (Signed)
Caller: Michael Blake/Patient; PCP: Lelon Perla.; CB#: [phone]; ; ; Call regarding Medication Refill; Has Appt Sched 08/29/11; needs short term refills on all meds until seen.  Has 7-10 days' worth of medications left but meds will not last till 08/29/11.  Also has R great toenail needs removing but his foot doctor will not remove it until Dr. Laury Axon says his blood sugars are okay enough to remove the nail.  Wants to know if can come to office for any needed blood work so that his foot doctor can have the information and remove the toenail.   INFO TO OFFICE FOR PROVIDER/STAFF REVIEW/REFILL/CALLBACK. MAY REACH PATIENT [phone].

## 2011-07-19 NOTE — Telephone Encounter (Signed)
Coming in tomorrow at 8:45 am

## 2011-07-19 NOTE — Telephone Encounter (Signed)
Orders put in.     KP 

## 2011-07-19 NOTE — Telephone Encounter (Signed)
Please schedule.    KP 

## 2011-07-20 ENCOUNTER — Other Ambulatory Visit (INDEPENDENT_AMBULATORY_CARE_PROVIDER_SITE_OTHER): Payer: BC Managed Care – PPO

## 2011-07-20 DIAGNOSIS — E785 Hyperlipidemia, unspecified: Secondary | ICD-10-CM

## 2011-07-20 DIAGNOSIS — Z0000 Encounter for general adult medical examination without abnormal findings: Secondary | ICD-10-CM

## 2011-07-20 DIAGNOSIS — E119 Type 2 diabetes mellitus without complications: Secondary | ICD-10-CM

## 2011-07-20 LAB — POCT URINALYSIS DIPSTICK
Ketones, UA: NEGATIVE
Leukocytes, UA: NEGATIVE
Nitrite, UA: NEGATIVE
Protein, UA: NEGATIVE
pH, UA: 5

## 2011-07-20 LAB — PSA: PSA: 0.66 ng/mL (ref 0.10–4.00)

## 2011-07-20 LAB — HEPATIC FUNCTION PANEL
Bilirubin, Direct: 0.3 mg/dL (ref 0.0–0.3)
Total Bilirubin: 2 mg/dL — ABNORMAL HIGH (ref 0.3–1.2)

## 2011-07-20 LAB — BASIC METABOLIC PANEL
BUN: 12 mg/dL (ref 6–23)
Calcium: 9.7 mg/dL (ref 8.4–10.5)
Chloride: 102 mEq/L (ref 96–112)
Creatinine, Ser: 0.6 mg/dL (ref 0.4–1.5)
GFR: 149.46 mL/min (ref >60.00)

## 2011-07-20 LAB — LIPID PANEL
Cholesterol: 80 mg/dL (ref 0–200)
HDL: 29.6 mg/dL — ABNORMAL LOW (ref >39.00)
LDL Cholesterol: 18 mg/dL (ref 0–99)
Total CHOL/HDL Ratio: 3
Triglycerides: 160 mg/dL — ABNORMAL HIGH (ref 0.0–149.0)
VLDL: 32 mg/dL (ref 0.0–40.0)

## 2011-07-20 LAB — HEMOGLOBIN A1C: Hgb A1c MFr Bld: 7.4 % — ABNORMAL HIGH (ref 4.6–6.5)

## 2011-07-20 NOTE — Progress Notes (Signed)
Pt took urine cup home, could not use the bathroom.

## 2011-07-27 LAB — MICROALBUMIN / CREATININE URINE RATIO: Microalb Creat Ratio: 0.8 mg/g (ref 0.0–30.0)

## 2011-07-27 MED ORDER — GLIMEPIRIDE 2 MG PO TABS: 2.0000 mg | ORAL_TABLET | Freq: Every day | ORAL | Status: DC

## 2011-07-28 ENCOUNTER — Encounter: Payer: Self-pay | Admitting: *Deleted

## 2011-08-10 ENCOUNTER — Telehealth: Payer: Self-pay | Admitting: Family Medicine

## 2011-08-10 MED ORDER — LISINOPRIL 5 MG PO TABS: 5.0000 mg | ORAL_TABLET | Freq: Every day | ORAL | Status: DC

## 2011-08-10 MED ORDER — SITAGLIPTIN PHOS-METFORMIN HCL 50-1000 MG PO TABS: 1.0000 | ORAL_TABLET | Freq: Two times a day (BID) | ORAL | Status: DC

## 2011-08-10 MED ORDER — SIMVASTATIN 20 MG PO TABS: 20.0000 mg | ORAL_TABLET | Freq: Every day | ORAL | Status: DC

## 2011-08-10 NOTE — Telephone Encounter (Signed)
Refill: Janumet 50-1,000mg  tablet. 90 day supply Simvastatin 20mg  tablet. 90 day supply Lisinopril 5mg  tablet. 90 day supply

## 2011-08-19 ENCOUNTER — Telehealth: Payer: Self-pay | Admitting: Family Medicine

## 2011-08-19 ENCOUNTER — Encounter: Payer: Self-pay | Admitting: Family Medicine

## 2011-08-19 NOTE — Telephone Encounter (Signed)
Msg left to call the office -- patient needs medical clearance according to letter from podiatrist.    KP

## 2011-08-19 NOTE — Telephone Encounter (Signed)
Per Dr. Ernst Spell request, patient dismissal initiated.  Patient was rude and using profanity with staff

## 2011-08-19 NOTE — Telephone Encounter (Signed)
Caller: Tysean/Patient; Phone Number: [phone]; Message from caller: Patient needing Dr Laury Axon or someone fax note over to [facility] confirming he can have toe nail removed.

## 2011-08-19 NOTE — Telephone Encounter (Signed)
Ok to give note 

## 2011-08-19 NOTE — Telephone Encounter (Signed)
Patient called and stated he thought he had this taken care of. He says dr.lowne told him his labs was fine and that he was ok to have it taken off. He is upset. I attempted to explain but he did not want to hear what I was saying. He also stated this is BS* and we up here dont; know what H we are doing. He is holding on 3
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
## 2011-08-25 NOTE — Telephone Encounter (Signed)
Dismissal from the practice letter sent out by certified mail. Will update upon delivery of signed return receipt. rmf

## 2011-08-26 ENCOUNTER — Telehealth: Payer: Self-pay | Admitting: Family Medicine

## 2011-08-26 NOTE — Telephone Encounter (Signed)
Dismissal Letter sent by Certified Mail 08/26/2011  Received Return Receipt showing the patient has gotten the Dismissal Letter 08/30/2011

## 2011-08-29 ENCOUNTER — Encounter: Payer: BC Managed Care – PPO | Admitting: Family Medicine

## 2011-08-29 ENCOUNTER — Other Ambulatory Visit: Payer: Self-pay | Admitting: *Deleted

## 2011-08-29 MED ORDER — FENOFIBRATE MICRONIZED 130 MG PO CAPS: 130.0000 mg | ORAL_CAPSULE | Freq: Every day | ORAL | Status: DC

## 2011-08-29 NOTE — Telephone Encounter (Signed)
Received signed receipt verifying delivery of certified letter. rmf

## 2011-12-29 ENCOUNTER — Other Ambulatory Visit: Payer: Self-pay | Admitting: Family Medicine

## 2016-03-04 ENCOUNTER — Ambulatory Visit (INDEPENDENT_AMBULATORY_CARE_PROVIDER_SITE_OTHER): Payer: Self-pay | Admitting: Podiatry

## 2016-03-04 ENCOUNTER — Encounter: Payer: Self-pay | Admitting: Podiatry

## 2016-03-04 VITALS — BP 146/90 | HR 79 | Resp 16

## 2016-03-04 DIAGNOSIS — L600 Ingrowing nail: Secondary | ICD-10-CM

## 2016-03-04 NOTE — Progress Notes (Signed)
   Subjective:    Patient ID: Michael DineMichael W Lambe, male    DOB: 09-28-1950, 65 y.o.   MRN: [id number]  HPI 65 year old male presents the office concerns of ingrown sounds a left big toe. He presents today requesting a left toenail be removed. He previously had the right toenail removed several years ago did very well. Left big toe nails thick and discolored and ingrown causing pain with pressure in shoes. He has tried trimming it with minimal relief. No other complaints today.   Review of Systems  All other systems reviewed and are negative.      Objective:   Physical Exam General: AAO x3, NAD  Dermatological: Left hallux toenail is hypertrophic, dystrophic, and discoloration with yellow discoloration. There is significant incurvation along the lateral nail border to lesser degree the medial nail border. There is tenderness the entire toenail. There is no surrounding redness or drainage or any swelling.  Vascular: DP/PT pulses 2/4, CRT less than 3 seconds. Pedal hair present. There is no pain with calf compression, swelling, warmth, erythema.   Neruologic: Grossly intact via light touch bilateral. Vibratory intact via tuning fork bilateral. Protective threshold with Semmes Wienstein monofilament intact to all pedal sites bilateral.   Musculoskeletal: No gross boney pedal deformities bilateral. No pain, crepitus, or limitation noted with foot and ankle range of motion bilateral. Muscular strength 5/5 in all groups tested bilateral.  Gait: Unassisted, Nonantalgic.     Assessment & Plan:   65 year old male presents with symptomatic left ingrown toenail/onychodystrophy  -Treatment options discussed including all alternatives, risks, and complications -Etiology of symptoms were discussed -At this time, the patient is requesting total nail removal with chemical matricectomy to the symptomatic portion of the nail. Risks and complications were discussed with the patient for which they understand  and  verbally consent to the procedure. Under sterile conditions a total of 3 mL of a mixture of 2% lidocaine plain and 0.5% Marcaine plain was infiltrated in a hallux block fashion. Once anesthetized, the skin was prepped in sterile fashion. A tourniquet was then applied. Next the left hallux nail border was then sharply excised making sure to remove the entire offending nail border. Once the nails were ensured to be removed area was debrided and the underlying skin was intact. There is no purulence identified in the procedure. Next phenol was then applied under standard conditions and copiously irrigated. Silvadene was applied. A dry sterile dressing was applied. After application of the dressing the tourniquet was removed and there is found to be an immediate capillary refill time to the digit. The patient tolerated the procedure well any complications. Post procedure instructions were discussed the patient for which he verbally understood. Follow-up in one week for nail check or sooner if any problems are to arise. Discussed signs/symptoms of infection and directed to call the office immediately should any occur or go directly to the emergency room. In the meantime, encouraged to call the office with any questions, concerns, changes symptoms.  Ovid CurdMatthew Wagoner, DPM

## 2016-03-04 NOTE — Patient Instructions (Signed)

## 2016-03-11 ENCOUNTER — Encounter: Payer: Self-pay | Admitting: Podiatry

## 2016-03-11 ENCOUNTER — Ambulatory Visit (INDEPENDENT_AMBULATORY_CARE_PROVIDER_SITE_OTHER): Payer: Self-pay | Admitting: Podiatry

## 2016-03-11 DIAGNOSIS — L600 Ingrowing nail: Secondary | ICD-10-CM

## 2016-03-11 DIAGNOSIS — Z9889 Other specified postprocedural states: Secondary | ICD-10-CM

## 2016-03-11 NOTE — Progress Notes (Signed)
Subjective: Michael Blake is a 66 y.o.  male returns to office today for follow up evaluation after having left Hallux total  nail avulsion performed. Patient has been soaking using epsom salts and applying topical antibiotic covered with bandaid daily. He states he has not had any pain and there has been no redness or drainage. Patient denies fevers, chills, nausea, vomiting. Denies any calf pain, chest pain, SOB.   Objective:  Vitals: Reviewed  General: Well developed, nourished, in no acute distress, alert and oriented x3   Dermatology: Skin is warm, dry and supple bilateral. Left hallux nail bedappears to be clean, dry, with mild granular tissue and surrounding scab. There is no surrounding erythema, edema, drainage/purulence. The remaining nails appear unremarkable at this time. There are no other lesions or other signs of infection present.  Neurovascular status: Intact. No lower extremity swelling; No pain with calf compression bilateral.  Musculoskeletal: No tenderness to palpation of the left hallux nail bed. Muscular strength within normal limits bilateral.   Assesement and Plan: S/p partial nail avulsion, doing well.   -Continue soaking in epsom salts twice a day followed by antibiotic ointment and a band-aid. Can leave uncovered at night. Continue this until completely healed.  -If the area has not healed in 2 weeks, call the office for follow-up appointment, or sooner if any problems arise.  -Monitor for any signs/symptoms of infection. Call the office immediately if any occur or go directly to the emergency room. Call with any questions/concerns.  Ovid CurdMatthew Wagoner, DPM
